# Patient Record
Sex: Female | Born: 2000 | Race: Black or African American | Hispanic: No | Marital: Single
Health system: Southern US, Community
[De-identification: ages and names within clinical notes are randomized; demographics above are authoritative.]

---
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
## 2021-01-20 DIAGNOSIS — Z118 Encounter for screening for other infectious and parasitic diseases: Secondary | ICD-10-CM | POA: Diagnosis not present

## 2021-01-20 DIAGNOSIS — Z01419 Encounter for gynecological examination (general) (routine) without abnormal findings: Secondary | ICD-10-CM | POA: Diagnosis not present

## 2021-01-20 DIAGNOSIS — Z113 Encounter for screening for infections with a predominantly sexual mode of transmission: Secondary | ICD-10-CM | POA: Diagnosis not present

## 2021-01-20 DIAGNOSIS — Z30015 Encounter for initial prescription of vaginal ring hormonal contraceptive: Secondary | ICD-10-CM | POA: Diagnosis not present

## 2021-01-24 DIAGNOSIS — A749 Chlamydial infection, unspecified: Secondary | ICD-10-CM | POA: Insufficient documentation

## 2021-01-30 DIAGNOSIS — R3589 Other polyuria: Secondary | ICD-10-CM | POA: Diagnosis not present

## 2021-02-18 DIAGNOSIS — R051 Acute cough: Secondary | ICD-10-CM | POA: Diagnosis not present

## 2021-03-05 ENCOUNTER — Telehealth: Payer: BC Managed Care – PPO | Admitting: Physician Assistant

## 2021-03-05 DIAGNOSIS — B9689 Other specified bacterial agents as the cause of diseases classified elsewhere: Secondary | ICD-10-CM

## 2021-03-05 DIAGNOSIS — J019 Acute sinusitis, unspecified: Secondary | ICD-10-CM | POA: Diagnosis not present

## 2021-03-05 MED ORDER — AMOXICILLIN-POT CLAVULANATE 875-125 MG PO TABS: 1.0000 | ORAL_TABLET | Freq: Two times a day (BID) | ORAL | 0 refills | Status: AC

## 2021-03-05 NOTE — Patient Instructions (Signed)
Janeisha Licklider, thank you for joining Leeanne Rio, PA-C for today's virtual visit.  While this provider is not your primary care provider (PCP), if your PCP is located in our provider database this encounter information will be shared with them immediately following your visit.  Consent: (Patient) Vicki Clark provided verbal consent for this virtual visit at the beginning of the encounter.  Current Medications: No current outpatient medications on file.   Medications ordered in this encounter:  No orders of the defined types were placed in this encounter.    *If you need refills on other medications prior to your next appointment, please contact your pharmacy*  Follow-Up: Call back or seek an in-person evaluation if the symptoms worsen or if the condition fails to improve as anticipated.  Other Instructions Please take antibiotic as directed.  Increase fluid intake.  Use Saline nasal spray.  Take a daily multivitamin.  Place a humidifier in the bedroom.  Please call or return clinic if symptoms are not improving.  Sinusitis Sinusitis is redness, soreness, and swelling (inflammation) of the paranasal sinuses. Paranasal sinuses are air pockets within the bones of your face (beneath the eyes, the middle of the forehead, or above the eyes). In healthy paranasal sinuses, mucus is able to drain out, and air is able to circulate through them by way of your nose. However, when your paranasal sinuses are inflamed, mucus and air can become trapped. This can allow bacteria and other germs to grow and cause infection. Sinusitis can develop quickly and last only a short time (acute) or continue over a long period (chronic). Sinusitis that lasts for more than 12 weeks is considered chronic.  CAUSES  Causes of sinusitis include: Allergies. Structural abnormalities, such as displacement of the cartilage that separates your nostrils (deviated septum), which can decrease the air flow through your nose  and sinuses and affect sinus drainage. Functional abnormalities, such as when the small hairs (cilia) that line your sinuses and help remove mucus do not work properly or are not present. SYMPTOMS  Symptoms of acute and chronic sinusitis are the same. The primary symptoms are pain and pressure around the affected sinuses. Other symptoms include: Upper toothache. Earache. Headache. Bad breath. Decreased sense of smell and taste. A cough, which worsens when you are lying flat. Fatigue. Fever. Thick drainage from your nose, which often is green and may contain pus (purulent). Swelling and warmth over the affected sinuses. DIAGNOSIS  Your caregiver will perform a physical exam. During the exam, your caregiver may: Look in your nose for signs of abnormal growths in your nostrils (nasal polyps). Tap over the affected sinus to check for signs of infection. View the inside of your sinuses (endoscopy) with a special imaging device with a light attached (endoscope), which is inserted into your sinuses. If your caregiver suspects that you have chronic sinusitis, one or more of the following tests may be recommended: Allergy tests. Nasal culture A sample of mucus is taken from your nose and sent to a lab and screened for bacteria. Nasal cytology A sample of mucus is taken from your nose and examined by your caregiver to determine if your sinusitis is related to an allergy. TREATMENT  Most cases of acute sinusitis are related to a viral infection and will resolve on their own within 10 days. Sometimes medicines are prescribed to help relieve symptoms (pain medicine, decongestants, nasal steroid sprays, or saline sprays).  However, for sinusitis related to a bacterial infection, your caregiver will prescribe  antibiotic medicines. These are medicines that will help kill the bacteria causing the infection.  Rarely, sinusitis is caused by a fungal infection. In theses cases, your caregiver will prescribe  antifungal medicine. For some cases of chronic sinusitis, surgery is needed. Generally, these are cases in which sinusitis recurs more than 3 times per year, despite other treatments. HOME CARE INSTRUCTIONS  Drink plenty of water. Water helps thin the mucus so your sinuses can drain more easily. Use a humidifier. Inhale steam 3 to 4 times a day (for example, sit in the bathroom with the shower running). Apply a warm, moist washcloth to your face 3 to 4 times a day, or as directed by your caregiver. Use saline nasal sprays to help moisten and clean your sinuses. Take over-the-counter or prescription medicines for pain, discomfort, or fever only as directed by your caregiver. SEEK IMMEDIATE MEDICAL CARE IF: You have increasing pain or severe headaches. You have nausea, vomiting, or drowsiness. You have swelling around your face. You have vision problems. You have a stiff neck. You have difficulty breathing. MAKE SURE YOU:  Understand these instructions. Will watch your condition. Will get help right away if you are not doing well or get worse. Document Released: 01/26/2005 Document Revised: 04/20/2011 Document Reviewed: 02/10/2011 [facility] Patient Information 2014 [place], [place].    If you have been instructed to have an in-person evaluation today at a local Urgent Care facility, please use the link below. It will take you to a list of all of our available [facility], including address, phone number and hours of operation. Please do not delay care.  [facility]  If you or a family member do not have a primary care provider, use the link below to schedule a visit and establish care. When you choose a [facility] primary care physician or advanced practice provider, you gain a long-term partner in health. Find a Primary Care Provider  Learn more about [facility] in-office and virtual care options: [facility]

## 2021-03-05 NOTE — Progress Notes (Signed)
Virtual Visit Consent   Vicki Clark, you are scheduled for a virtual visit with a [facility] provider today.     Just as with appointments in the office, your consent must be obtained to participate.  Your consent will be active for this visit and any virtual visit you may have with one of our providers in the next 365 days.     If you have a MyChart account, a copy of this consent can be sent to you electronically.  All virtual visits are billed to your insurance company just like a traditional visit in the office.    As this is a virtual visit, video technology does not allow for your provider to perform a traditional examination.  This may limit your provider's ability to fully assess your condition.  If your provider identifies any concerns that need to be evaluated in person or the need to arrange testing (such as labs, EKG, etc.), we will make arrangements to do so.     Although advances in technology are sophisticated, we cannot ensure that it will always work on either your end or our end.  If the connection with a video visit is poor, the visit may have to be switched to a telephone visit.  With either a video or telephone visit, we are not always able to ensure that we have a secure connection.     I need to obtain your verbal consent now.   Are you willing to proceed with your visit today?    Addalyn Daily has provided verbal consent on 03/05/2021 for a virtual visit (video or telephone).   Piedad Climes, [place]   Date: 03/05/2021 10:29 AM   Virtual Visit via Video Note   I, Piedad Climes, connected with  Makinzy Cleere  ([id number], 2000-12-10) on 03/05/21 at  9:45 AM EST by a video-enabled telemedicine application and verified that I am speaking with the correct person using two identifiers.  Location: Patient: Virtual Visit Location Patient: Home Provider: Virtual Visit Location Provider: Home Office   I discussed the limitations of evaluation and management by  telemedicine and the availability of in person appointments. The patient expressed understanding and agreed to proceed.    History of Present Illness: Breely Panik is a 21 y.o. who identifies as a female who was assigned female at birth, and is being seen today for nasal and head congestion with significant sinus pressure. Has had intermittent symptoms over the past couple of week with an acute worsening of sinus pain in the past few days. Denies fever, chills or aches. Notes sinus pain.   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  Resting comfortably at home.  Head is normocephalic, atraumatic.  No labored breathing. Speech is clear and coherent with logical content.  Patient is alert and oriented at baseline.   Assessment and Plan: 1. Acute bacterial sinusitis  Rx Augmentin.  Increase fluids.  Rest.  Saline nasal spray.  Probiotic.  Mucinex as directed.  Humidifier in bedroom.  Call or return to clinic if symptoms are not improving.   Follow Up Instructions: I discussed the assessment and treatment plan with the patient. The  patient was provided an opportunity to ask questions and all were answered. The patient agreed with the plan and demonstrated an understanding of the instructions.  A copy of instructions were sent to the patient via MyChart unless otherwise noted below.   The patient was advised to call back or seek an in-person evaluation if the symptoms worsen or if the condition fails to improve as anticipated.  Time:  I spent 15 minutes with the patient via telehealth technology discussing the above  problems/concerns.    Piedad Climes, PA-C

## 2021-04-02 DIAGNOSIS — Z113 Encounter for screening for infections with a predominantly sexual mode of transmission: Secondary | ICD-10-CM | POA: Diagnosis not present

## 2021-04-02 DIAGNOSIS — Z118 Encounter for screening for other infectious and parasitic diseases: Secondary | ICD-10-CM | POA: Diagnosis not present

## 2021-04-02 DIAGNOSIS — A749 Chlamydial infection, unspecified: Secondary | ICD-10-CM | POA: Diagnosis not present

## 2021-04-02 DIAGNOSIS — Z112 Encounter for screening for other bacterial diseases: Secondary | ICD-10-CM | POA: Diagnosis not present

## 2021-05-22 DIAGNOSIS — K599 Functional intestinal disorder, unspecified: Secondary | ICD-10-CM | POA: Diagnosis not present

## 2021-05-23 DIAGNOSIS — K599 Functional intestinal disorder, unspecified: Secondary | ICD-10-CM | POA: Diagnosis not present

## 2021-07-24 DIAGNOSIS — K5900 Constipation, unspecified: Secondary | ICD-10-CM | POA: Diagnosis not present

## 2021-07-24 DIAGNOSIS — R195 Other fecal abnormalities: Secondary | ICD-10-CM | POA: Diagnosis not present

## 2021-07-24 DIAGNOSIS — R197 Diarrhea, unspecified: Secondary | ICD-10-CM | POA: Diagnosis not present

## 2021-07-24 DIAGNOSIS — R109 Unspecified abdominal pain: Secondary | ICD-10-CM | POA: Diagnosis not present

## 2021-09-02 DIAGNOSIS — Z0000 Encounter for general adult medical examination without abnormal findings: Secondary | ICD-10-CM | POA: Diagnosis not present

## 2021-09-16 DIAGNOSIS — R0989 Other specified symptoms and signs involving the circulatory and respiratory systems: Secondary | ICD-10-CM | POA: Diagnosis not present

## 2021-09-16 DIAGNOSIS — R059 Cough, unspecified: Secondary | ICD-10-CM | POA: Diagnosis not present

## 2021-09-16 DIAGNOSIS — J069 Acute upper respiratory infection, unspecified: Secondary | ICD-10-CM | POA: Diagnosis not present

## 2021-09-17 DIAGNOSIS — H5213 Myopia, bilateral: Secondary | ICD-10-CM | POA: Diagnosis not present

## 2021-09-17 DIAGNOSIS — H52223 Regular astigmatism, bilateral: Secondary | ICD-10-CM | POA: Diagnosis not present

## 2021-10-02 DIAGNOSIS — Z111 Encounter for screening for respiratory tuberculosis: Secondary | ICD-10-CM | POA: Diagnosis not present

## 2021-10-17 DIAGNOSIS — N911 Secondary amenorrhea: Secondary | ICD-10-CM | POA: Diagnosis not present

## 2022-01-05 DIAGNOSIS — Z1152 Encounter for screening for COVID-19: Secondary | ICD-10-CM | POA: Diagnosis not present

## 2022-01-05 DIAGNOSIS — J302 Other seasonal allergic rhinitis: Secondary | ICD-10-CM | POA: Diagnosis not present

## 2022-01-09 DIAGNOSIS — R030 Elevated blood-pressure reading, without diagnosis of hypertension: Secondary | ICD-10-CM | POA: Diagnosis not present

## 2022-01-19 DIAGNOSIS — R030 Elevated blood-pressure reading, without diagnosis of hypertension: Secondary | ICD-10-CM | POA: Diagnosis not present

## 2022-01-19 DIAGNOSIS — Z020 Encounter for examination for admission to educational institution: Secondary | ICD-10-CM | POA: Diagnosis not present

## 2022-04-13 DIAGNOSIS — Z01419 Encounter for gynecological examination (general) (routine) without abnormal findings: Secondary | ICD-10-CM | POA: Diagnosis not present

## 2022-04-13 DIAGNOSIS — Z118 Encounter for screening for other infectious and parasitic diseases: Secondary | ICD-10-CM | POA: Diagnosis not present

## 2022-04-13 DIAGNOSIS — Z124 Encounter for screening for malignant neoplasm of cervix: Secondary | ICD-10-CM | POA: Diagnosis not present

## 2022-04-13 DIAGNOSIS — Z112 Encounter for screening for other bacterial diseases: Secondary | ICD-10-CM | POA: Diagnosis not present

## 2022-04-13 DIAGNOSIS — Z113 Encounter for screening for infections with a predominantly sexual mode of transmission: Secondary | ICD-10-CM | POA: Diagnosis not present

## 2022-06-29 DIAGNOSIS — J039 Acute tonsillitis, unspecified: Secondary | ICD-10-CM | POA: Diagnosis not present

## 2022-08-07 IMAGING — DX DG CHEST 2V
2 series · 2 of 2 positions shown · non-contrast
Comparison: None.

CLINICAL DATA: Chest pain.

EXAM:
CHEST - 2 VIEW

[chest pa]
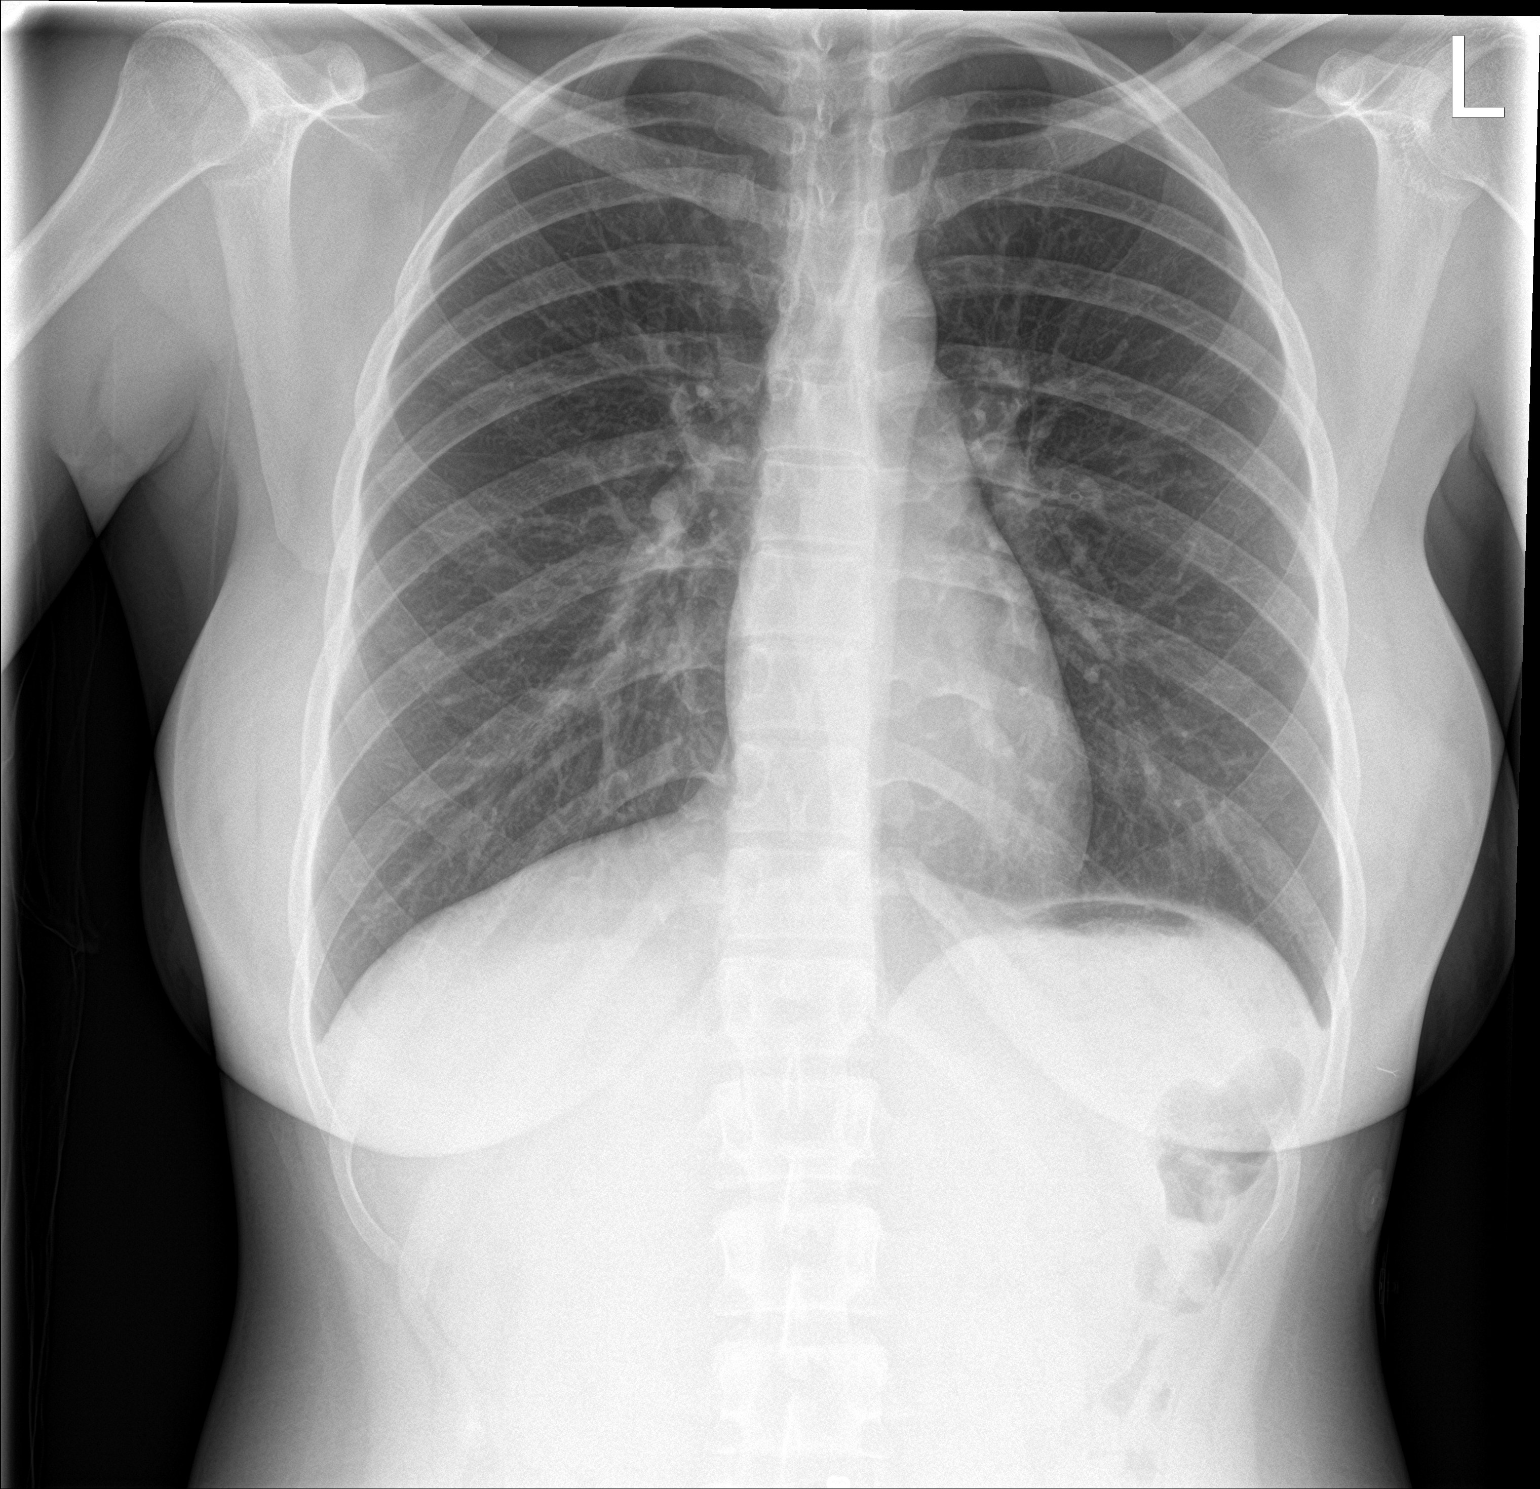

[chest lat]
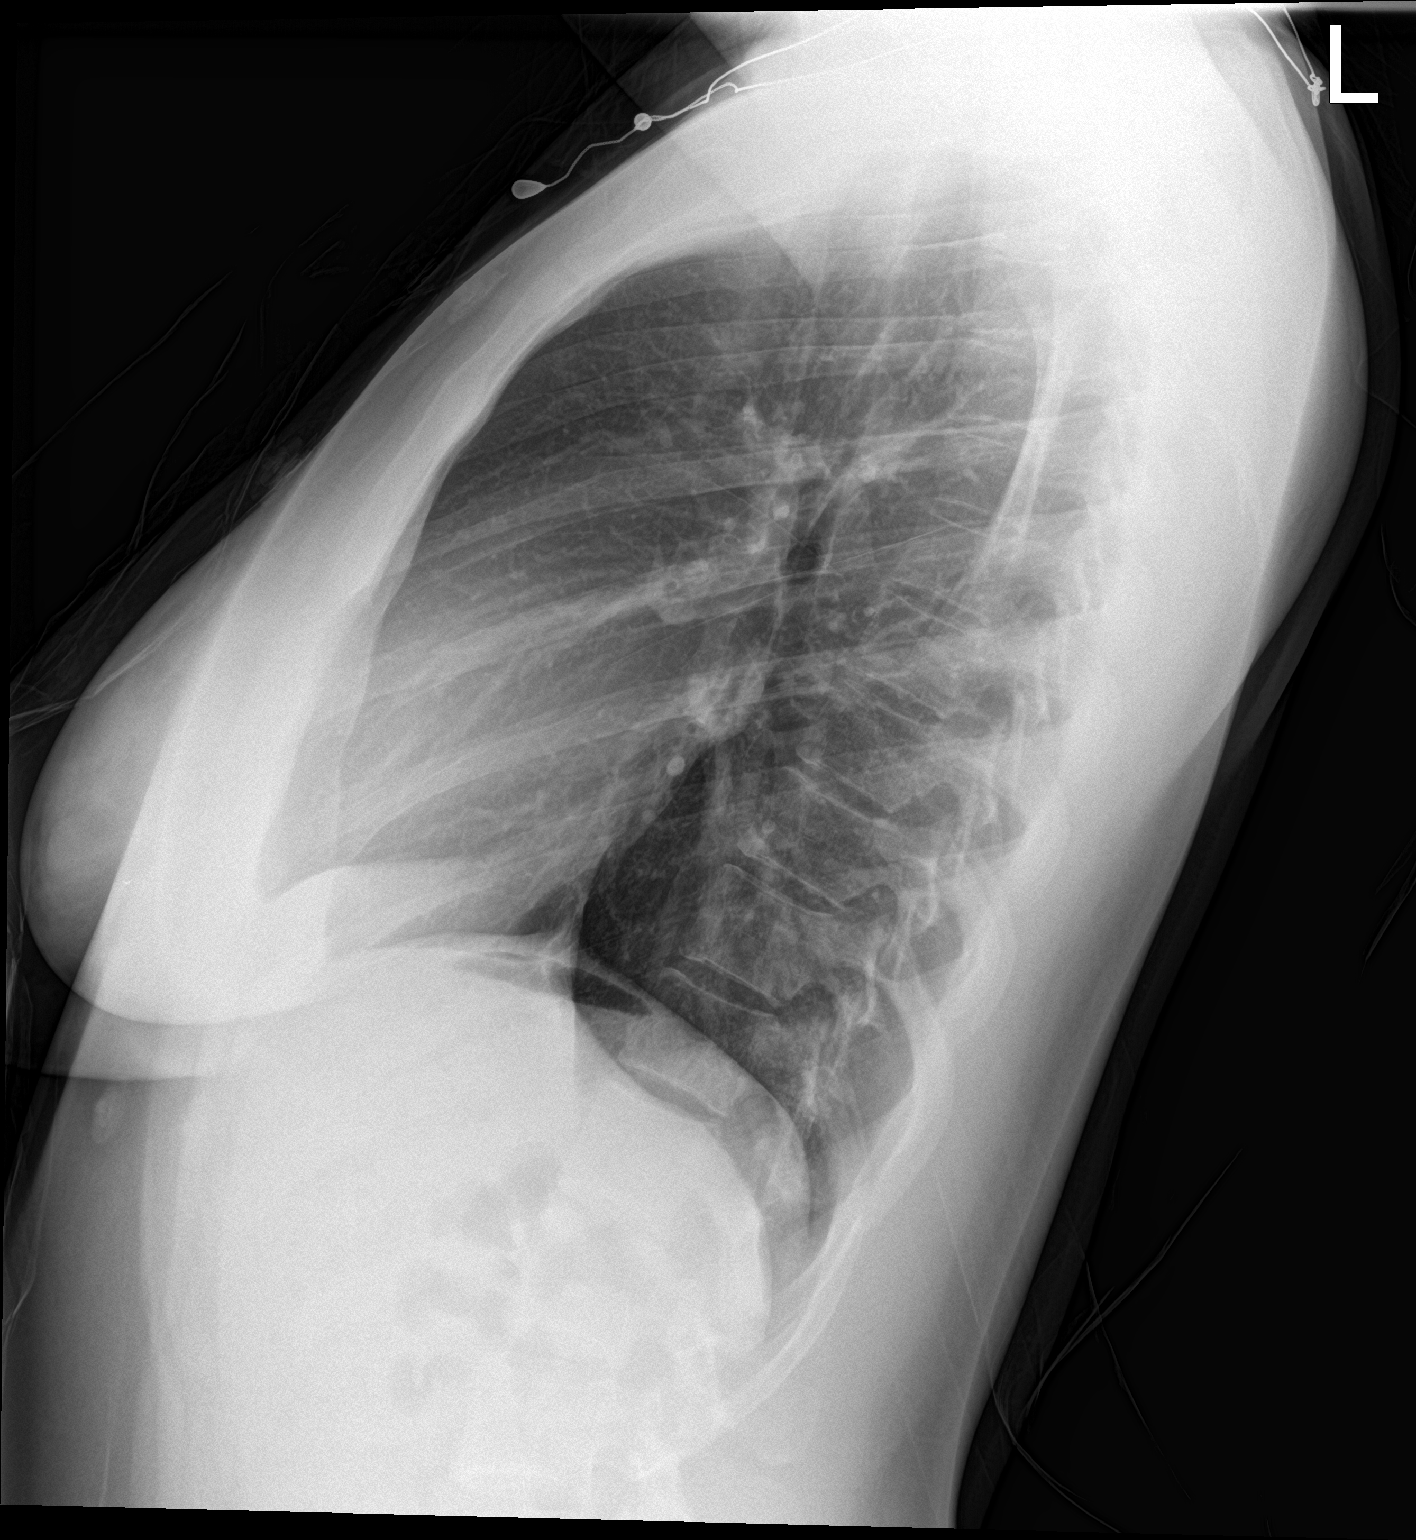

[2 of 2 positions shown; findings below may reference images not displayed]

FINDINGS: The heart size and mediastinal contours are within normal limits.
Both lungs are clear. The visualized skeletal structures are
unremarkable.
IMPRESSION: No active cardiopulmonary disease.

## 2022-08-27 DIAGNOSIS — Z0489 Encounter for examination and observation for other specified reasons: Secondary | ICD-10-CM | POA: Diagnosis not present

## 2022-09-30 DIAGNOSIS — B360 Pityriasis versicolor: Secondary | ICD-10-CM | POA: Diagnosis not present

## 2022-10-17 DIAGNOSIS — R0981 Nasal congestion: Secondary | ICD-10-CM | POA: Diagnosis not present

## 2022-10-17 DIAGNOSIS — J069 Acute upper respiratory infection, unspecified: Secondary | ICD-10-CM | POA: Diagnosis not present

## 2022-11-10 DIAGNOSIS — L300 Nummular dermatitis: Secondary | ICD-10-CM | POA: Diagnosis not present

## 2022-11-18 DIAGNOSIS — E559 Vitamin D deficiency, unspecified: Secondary | ICD-10-CM | POA: Diagnosis not present

## 2022-11-18 DIAGNOSIS — E8881 Metabolic syndrome: Secondary | ICD-10-CM | POA: Diagnosis not present

## 2022-11-18 DIAGNOSIS — Z0000 Encounter for general adult medical examination without abnormal findings: Secondary | ICD-10-CM | POA: Diagnosis not present
